# Patient Record
Sex: Male | Born: 1970 | Race: Black or African American | Hispanic: No | Marital: Single | Smoking: Current every day smoker
Health system: Southern US, Community
[De-identification: ages and names within clinical notes are randomized; demographics above are authoritative.]

## PROBLEM LIST (undated history)

## (undated) DIAGNOSIS — F209 Schizophrenia, unspecified: Secondary | ICD-10-CM

---

## 2014-05-22 DIAGNOSIS — F209 Schizophrenia, unspecified: Secondary | ICD-10-CM | POA: Diagnosis not present

## 2014-07-07 DIAGNOSIS — F209 Schizophrenia, unspecified: Secondary | ICD-10-CM | POA: Diagnosis not present

## 2014-09-13 DIAGNOSIS — F209 Schizophrenia, unspecified: Secondary | ICD-10-CM | POA: Diagnosis not present

## 2014-12-05 DIAGNOSIS — F209 Schizophrenia, unspecified: Secondary | ICD-10-CM | POA: Diagnosis not present

## 2015-02-19 DIAGNOSIS — F209 Schizophrenia, unspecified: Secondary | ICD-10-CM | POA: Diagnosis not present

## 2015-06-11 DIAGNOSIS — F209 Schizophrenia, unspecified: Secondary | ICD-10-CM | POA: Diagnosis not present

## 2015-08-23 ENCOUNTER — Emergency Department (HOSPITAL_COMMUNITY)
Admission: EM | Admit: 2015-08-23 | Discharge: 2015-08-23 | Disposition: A | Discharge status: Home / Self Care | Payer: Self-pay | Attending: Emergency Medicine | Admitting: Emergency Medicine

## 2015-08-23 ENCOUNTER — Encounter (HOSPITAL_COMMUNITY): Payer: Self-pay | Admitting: Emergency Medicine

## 2015-08-23 DIAGNOSIS — M25541 Pain in joints of right hand: Secondary | ICD-10-CM

## 2015-08-23 DIAGNOSIS — Z8659 Personal history of other mental and behavioral disorders: Secondary | ICD-10-CM | POA: Insufficient documentation

## 2015-08-23 DIAGNOSIS — M25542 Pain in joints of left hand: Secondary | ICD-10-CM | POA: Insufficient documentation

## 2015-08-23 DIAGNOSIS — M67431 Ganglion, right wrist: Secondary | ICD-10-CM | POA: Insufficient documentation

## 2015-08-23 DIAGNOSIS — F17200 Nicotine dependence, unspecified, uncomplicated: Secondary | ICD-10-CM | POA: Insufficient documentation

## 2015-08-23 HISTORY — DX: Schizophrenia, unspecified: F20.9

## 2015-08-23 MED ORDER — ACETAMINOPHEN 500 MG PO TABS: 500.0000 mg | ORAL_TABLET | Freq: Four times a day (QID) | ORAL | Status: AC | PRN

## 2015-08-23 NOTE — Discharge Instructions (Signed)
Ganglion Cyst A ganglion cyst is a noncancerous, fluid-filled lump that occurs near joints or tendons. The ganglion cyst grows out of a joint or the lining of a tendon. It most often develops in the hand or wrist, but it can also develop in the shoulder, elbow, hip, knee, ankle, or foot. The round or oval ganglion cyst can be the size of a pea or larger than a grape. Increased activity may enlarge the size of the cyst because more fluid starts to build up.  CAUSES It is not known what causes a ganglion cyst to grow. However, it may be related to:  Inflammation or irritation around the joint.  An injury.  Repetitive movements or overuse.  Arthritis. RISK FACTORS Risk factors include:  Being a woman.  Being age 44-50. SIGNS AND SYMPTOMS Symptoms may include:   A lump. This most often appears on the hand or wrist, but it can occur in other areas of the body.  Tingling.  Pain.  Numbness.  Muscle weakness.  Weak grip.  Less movement in a joint. DIAGNOSIS Ganglion cysts are most often diagnosed based on a physical exam. Your health care provider will feel the lump and may shine a light alongside it. If it is a ganglion cyst, a light often shines through it. Your health care provider may order an X-ray, ultrasound, or MRI to rule out other conditions. TREATMENT Ganglion cysts usually go away on their own without treatment. If pain or other symptoms are involved, treatment may be needed. Treatment is also needed if the ganglion cyst limits your movement or if it gets infected. Treatment may include:  Wearing a brace or splint on your wrist or finger.  Taking anti-inflammatory medicine.  Draining fluid from the lump with a needle (aspiration).  Injecting a steroid into the joint.  Surgery to remove the ganglion cyst. HOME CARE INSTRUCTIONS  Do not press on the ganglion cyst, poke it with a needle, or hit it.  Take medicines only as directed by your health care  provider.  Wear your brace or splint as directed by your health care provider.  Watch your ganglion cyst for any changes.  Keep all follow-up visits as directed by your health care provider. This is important. SEEK MEDICAL CARE IF:  Your ganglion cyst becomes larger or more painful.  You have increased redness, red streaks, or swelling.  You have pus coming from the lump.  You have weakness or numbness in the affected area.  You have a fever or chills.   This information is not intended to replace advice given to you by your health care provider. Make sure you discuss any questions you have with your health care provider.   Document Released: 05/02/2000 Document Revised: 05/26/2014 Document Reviewed: 10/18/2013 Elsevier Interactive Patient Education 2016 [employer]  Musculoskeletal Pain Musculoskeletal pain is muscle and boney aches and pains. These pains can occur in any part of the body. Your caregiver may treat you without knowing the cause of the pain. They may treat you if blood or urine tests, X-rays, and other tests were normal.  CAUSES There is often not a definite cause or reason for these pains. These pains may be caused by a type of germ (virus). The discomfort may also come from overuse. Overuse includes working out too hard when your body is not fit. Boney aches also come from weather changes. Bone is sensitive to atmospheric pressure changes. HOME CARE INSTRUCTIONS   Ask when your test results will be  ready. Make sure you get your test results.  Only take over-the-counter or prescription medicines for pain, discomfort, or fever as directed by your caregiver. If you were given medications for your condition, do not drive, operate machinery or power tools, or sign legal documents for 24 hours. Do not drink alcohol. Do not take sleeping pills or other medications that may interfere with treatment.  Continue all activities unless the activities cause more pain. When  the pain lessens, slowly resume normal activities. Gradually increase the intensity and duration of the activities or exercise.  During periods of severe pain, bed rest may be helpful. Lay or sit in any position that is comfortable.  Putting ice on the injured area.  Put ice in a bag.  Place a towel between your skin and the bag.  Leave the ice on for 15 to 20 minutes, 3 to 4 times a day.  Follow up with your caregiver for continued problems and no reason can be found for the pain. If the pain becomes worse or does not go away, it may be necessary to repeat tests or do additional testing. Your caregiver may need to look further for a possible cause. SEEK IMMEDIATE MEDICAL CARE IF:  You have pain that is getting worse and is not relieved by medications.  You develop chest pain that is associated with shortness or breath, sweating, feeling sick to your stomach (nauseous), or throw up (vomit).  Your pain becomes localized to the abdomen.  You develop any new symptoms that seem different or that concern you. MAKE SURE YOU:   Understand these instructions.  Will watch your condition.  Will get help right away if you are not doing well or get worse.   This information is not intended to replace advice given to you by your health care provider. Make sure you discuss any questions you have with your health care provider.   Document Released: 05/05/2005 Document Revised: 07/28/2011 Document Reviewed: 01/07/2013 Elsevier Interactive Patient Education [employer]

## 2015-08-23 NOTE — ED Provider Notes (Signed)
CSN: [id number]     Arrival date & time 08/23/15  1226 History  By signing my name below, I, Travis Anderson, attest that this documentation has been prepared under the direction and in the presence of Fayrene HelperBowie Marshella Tello, PA-C. Electronically Signed: Ronney LionSuzanne Anderson, ED Scribe. 08/23/2015. 2:01 PM.  Chief Complaint  Patient presents with  . Hand Pain   The history is provided by the patient. No language interpreter was used.    HPI Comments: Travis BunnellKwame Anderson is a 45 y.o. male who presents to the Emergency Department complaining of bilateral hand pain that began months ago. He states that for the past several months, he has woken up feeling like his hand is tight. Patient states he does heavy lifting. He denies a history of DM. He states he has been running hot water over his hand with no relief. Patient states he is right-hand-dominant. Patient has NKDA. He denies dropping objects, having focal weakness, hx of carpal tunnel syndrome, or prior hx of DVTs.    Pt notice a nodule to his right wrist for more than a month.  Denies any injury, report occasional sharp pain, not currently.    Past Medical History  Diagnosis Date  . Schizophrenia (HCC)    History reviewed. No pertinent past surgical history. No family history on file. Social History  Substance Use Topics  . Smoking status: Current Every Day Smoker  . Smokeless tobacco: None  . Alcohol Use: No    Review of Systems  Musculoskeletal: Positive for myalgias (bilateral hand pain).      Allergies  Review of patient's allergies indicates no known allergies.  Home Medications   Prior to Admission medications   Not on File   BP 110/61 mmHg  Pulse 85  Temp(Src) 98.4 F (36.9 C) (Oral)  Resp 16  SpO2 97% Physical Exam  Constitutional: He is oriented to person, place, and time. He appears well-developed and well-nourished. No distress.  HENT:  Head: Normocephalic and atraumatic.  Eyes: Conjunctivae and EOM are normal.  Neck: Neck supple. No  tracheal deviation present.  Cardiovascular: Normal rate.   Pulmonary/Chest: Effort normal. No respiratory distress.  Musculoskeletal: Normal range of motion.  Right hand: A mobile nodule noted to the dorsum of the hand, proximal to right wrist. Non-tender, no signs of infection.  Bilateral hands: Grip strength normal. Normal ROM. Brisk cap refill. Radial pulses normal. Skin is normal.  Neurological: He is alert and oriented to person, place, and time.  Skin: Skin is warm and dry.  Psychiatric: He has a normal mood and affect. His behavior is normal.  Nursing note and vitals reviewed.   ED Course  Procedures (including critical care time)  DIAGNOSTIC STUDIES: Oxygen Saturation is 97% on RA, normal by my interpretation.    COORDINATION OF CARE: 2:01 PM - Suspect early arthritis, in addition to ganglion cyst on right hand. Discussed treatment plan with pt at bedside which includes Tylenol as needed for pain. Recommended exercises. Pt verbalized understanding and agreed to plan.    MDM   Final diagnoses:  Arthralgia of hands, bilateral  Ganglion cyst of wrist, right   BP 110/61 mmHg  Pulse 85  Temp(Src) 98.4 F (36.9 C) (Oral)  Resp 16  SpO2 97%   I personally performed the services described in this documentation, which was scribed in my presence. The recorded information has been reviewed and is accurate.       Fayrene HelperBowie Reatha Sur, PA-C 08/23/15 1416  Benjiman CoreNathan Pickering, MD 08/24/15 [phone]

## 2015-08-23 NOTE — ED Notes (Signed)
C/o bilateral hand pain when he wakes up in the am. Onset "months ago". Also has nodule on right wrist.

## 2015-09-12 DIAGNOSIS — F209 Schizophrenia, unspecified: Secondary | ICD-10-CM | POA: Diagnosis not present

## 2015-12-05 DIAGNOSIS — F209 Schizophrenia, unspecified: Secondary | ICD-10-CM | POA: Diagnosis not present

## 2016-02-19 DIAGNOSIS — F209 Schizophrenia, unspecified: Secondary | ICD-10-CM | POA: Diagnosis not present

## 2016-05-07 DIAGNOSIS — F209 Schizophrenia, unspecified: Secondary | ICD-10-CM | POA: Diagnosis not present

## 2017-07-17 DIAGNOSIS — R1110 Vomiting, unspecified: Secondary | ICD-10-CM | POA: Diagnosis not present

## 2017-07-17 DIAGNOSIS — Z5321 Procedure and treatment not carried out due to patient leaving prior to being seen by health care provider: Secondary | ICD-10-CM | POA: Diagnosis not present

## 2017-08-06 ENCOUNTER — Emergency Department (HOSPITAL_COMMUNITY): Payer: Medicare Other

## 2017-08-06 ENCOUNTER — Emergency Department (HOSPITAL_COMMUNITY)
Admission: EM | Admit: 2017-08-06 | Discharge: 2017-08-06 | Disposition: A | Discharge status: Home / Self Care | Payer: Medicare Other | Attending: Emergency Medicine | Admitting: Emergency Medicine

## 2017-08-06 ENCOUNTER — Encounter (HOSPITAL_COMMUNITY): Payer: Self-pay

## 2017-08-06 ENCOUNTER — Other Ambulatory Visit: Payer: Self-pay

## 2017-08-06 DIAGNOSIS — F17200 Nicotine dependence, unspecified, uncomplicated: Secondary | ICD-10-CM | POA: Diagnosis not present

## 2017-08-06 DIAGNOSIS — S8992XA Unspecified injury of left lower leg, initial encounter: Secondary | ICD-10-CM | POA: Diagnosis not present

## 2017-08-06 DIAGNOSIS — M545 Low back pain: Secondary | ICD-10-CM | POA: Diagnosis not present

## 2017-08-06 DIAGNOSIS — M25562 Pain in left knee: Secondary | ICD-10-CM | POA: Diagnosis not present

## 2017-08-06 DIAGNOSIS — Y9389 Activity, other specified: Secondary | ICD-10-CM | POA: Diagnosis not present

## 2017-08-06 DIAGNOSIS — S8991XA Unspecified injury of right lower leg, initial encounter: Secondary | ICD-10-CM | POA: Diagnosis not present

## 2017-08-06 DIAGNOSIS — M542 Cervicalgia: Secondary | ICD-10-CM | POA: Diagnosis not present

## 2017-08-06 DIAGNOSIS — S161XXA Strain of muscle, fascia and tendon at neck level, initial encounter: Secondary | ICD-10-CM | POA: Diagnosis not present

## 2017-08-06 DIAGNOSIS — Y92410 Unspecified street and highway as the place of occurrence of the external cause: Secondary | ICD-10-CM | POA: Insufficient documentation

## 2017-08-06 DIAGNOSIS — S86911A Strain of unspecified muscle(s) and tendon(s) at lower leg level, right leg, initial encounter: Secondary | ICD-10-CM | POA: Diagnosis not present

## 2017-08-06 DIAGNOSIS — S86912A Strain of unspecified muscle(s) and tendon(s) at lower leg level, left leg, initial encounter: Secondary | ICD-10-CM | POA: Diagnosis not present

## 2017-08-06 DIAGNOSIS — M546 Pain in thoracic spine: Secondary | ICD-10-CM | POA: Diagnosis not present

## 2017-08-06 DIAGNOSIS — T148XXA Other injury of unspecified body region, initial encounter: Secondary | ICD-10-CM

## 2017-08-06 DIAGNOSIS — Y999 Unspecified external cause status: Secondary | ICD-10-CM | POA: Insufficient documentation

## 2017-08-06 DIAGNOSIS — M25561 Pain in right knee: Secondary | ICD-10-CM | POA: Diagnosis not present

## 2017-08-06 DIAGNOSIS — S39012A Strain of muscle, fascia and tendon of lower back, initial encounter: Secondary | ICD-10-CM | POA: Diagnosis not present

## 2017-08-06 DIAGNOSIS — S199XXA Unspecified injury of neck, initial encounter: Secondary | ICD-10-CM | POA: Diagnosis not present

## 2017-08-06 MED ORDER — METHOCARBAMOL 500 MG PO TABS: 500.0000 mg | ORAL_TABLET | Freq: Two times a day (BID) | ORAL | 0 refills | Status: AC

## 2017-08-06 MED ORDER — METHOCARBAMOL 500 MG PO TABS
500.0000 mg | ORAL_TABLET | Freq: Once | ORAL | Status: AC
  Administered 2017-08-06: 500 mg via ORAL
  Filled 2017-08-06: qty 1

## 2017-08-06 NOTE — ED Notes (Signed)
See PA note for assessment  

## 2017-08-06 NOTE — ED Notes (Signed)
Pt departed in NAD, refused use of wheelchair.  

## 2017-08-06 NOTE — Discharge Instructions (Signed)
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
## 2018-04-09 ENCOUNTER — Encounter (HOSPITAL_COMMUNITY): Payer: Self-pay | Admitting: Emergency Medicine

## 2018-04-09 ENCOUNTER — Other Ambulatory Visit: Payer: Self-pay

## 2018-04-09 ENCOUNTER — Emergency Department (HOSPITAL_COMMUNITY)
Admission: EM | Admit: 2018-04-09 | Discharge: 2018-04-09 | Disposition: A | Discharge status: Home / Self Care | Payer: Medicare Other | Attending: Emergency Medicine | Admitting: Emergency Medicine

## 2018-04-09 DIAGNOSIS — F17200 Nicotine dependence, unspecified, uncomplicated: Secondary | ICD-10-CM | POA: Diagnosis not present

## 2018-04-09 DIAGNOSIS — R112 Nausea with vomiting, unspecified: Secondary | ICD-10-CM | POA: Diagnosis not present

## 2018-04-09 LAB — CBC
HCT: 39.8 % (ref 39.0–52.0)
HEMOGLOBIN: 13.5 g/dL (ref 13.0–17.0)
MCH: 27.7 pg (ref 26.0–34.0)
MCHC: 33.9 g/dL (ref 30.0–36.0)
MCV: 81.6 fL (ref 80.0–100.0)
Platelets: 231 10*3/uL (ref 150–400)
RBC: 4.88 MIL/uL (ref 4.22–5.81)
RDW: 12.9 % (ref 11.5–15.5)
WBC: 4.1 10*3/uL (ref 4.0–10.5)
nRBC: 0 % (ref 0.0–0.2)

## 2018-04-09 LAB — COMPREHENSIVE METABOLIC PANEL
ALBUMIN: 3.9 g/dL (ref 3.5–5.0)
ALT: 16 U/L (ref 0–44)
AST: 21 U/L (ref 15–41)
Alkaline Phosphatase: 43 U/L (ref 38–126)
Anion gap: 6 (ref 5–15)
BUN: 19 mg/dL (ref 6–20)
CHLORIDE: 109 mmol/L (ref 98–111)
CO2: 24 mmol/L (ref 22–32)
CREATININE: 1.23 mg/dL (ref 0.61–1.24)
Calcium: 8.7 mg/dL — ABNORMAL LOW (ref 8.9–10.3)
GFR calc Af Amer: >60 mL/min (ref >60)
GLUCOSE: 101 mg/dL — AB (ref 70–99)
POTASSIUM: 3.9 mmol/L (ref 3.5–5.1)
Sodium: 139 mmol/L (ref 135–145)
Total Bilirubin: 0.9 mg/dL (ref 0.3–1.2)
Total Protein: 6.9 g/dL (ref 6.5–8.1)

## 2018-04-09 LAB — LIPASE, BLOOD: LIPASE: 43 U/L (ref 11–51)

## 2018-04-09 MED ORDER — LACTATED RINGERS IV BOLUS
1000.0000 mL | Freq: Once | INTRAVENOUS | Status: AC
  Administered 2018-04-09: 1000 mL via INTRAVENOUS

## 2018-04-09 MED ORDER — ONDANSETRON HCL 4 MG/2ML IJ SOLN
4.0000 mg | Freq: Once | INTRAMUSCULAR | Status: AC
  Administered 2018-04-09: 4 mg via INTRAVENOUS
  Filled 2018-04-09: qty 2

## 2018-04-09 MED ORDER — ONDANSETRON 4 MG PO TBDP: 4.0000 mg | ORAL_TABLET | Freq: Three times a day (TID) | ORAL | 0 refills | Status: AC | PRN

## 2018-04-09 NOTE — ED Provider Notes (Signed)
[facility]   CSN: [id number] Arrival date & time: 04/09/18  2011     History   Chief Complaint Chief Complaint  Patient presents with  . Emesis    HPI Maryfrances BunnellKwame Coverdale is a 47 y.o. male.  The history is provided by the patient. No language interpreter was used.  Emesis   This is a new problem. The current episode started more than 2 days ago. The problem occurs 2 to 4 times per day. The problem has not changed since onset.The emesis has an appearance of stomach contents. There has been no fever. Associated symptoms include diarrhea. Pertinent negatives include no abdominal pain, no arthralgias, no chills, no cough, no fever, no headaches, no myalgias and no sweats.    Past Medical History:  Diagnosis Date  . Schizophrenia (HCC)     There are no active problems to display for this patient.   History reviewed. No pertinent surgical history.      Home Medications    Prior to Admission medications   Medication Sig Start Date End Date Taking? Authorizing Provider  acetaminophen (TYLENOL) 500 MG tablet Take 1 tablet (500 mg total) by mouth every 6 (six) hours as needed. Patient not taking: Reported on 04/09/2018 08/23/15   Fayrene Helperran, Bowie, PA-C  methocarbamol (ROBAXIN) 500 MG tablet Take 1 tablet (500 mg total) by mouth 2 (two) times daily. Patient not taking: Reported on 04/09/2018 08/06/17   Graciella FreerLayden, Lindsey A, PA-C  ondansetron (ZOFRAN ODT) 4 MG disintegrating tablet Take 1 tablet (4 mg total) by mouth every 8 (eight) hours as needed for nausea or vomiting. 04/09/18   Melene PlanFloyd, Dan, DO    Family History No family history on file.  Social History Social History   Tobacco Use  . Smoking status: Current Every Day Smoker  . Smokeless tobacco: Never Used  Substance Use Topics  . Alcohol use: Yes  . Drug use: Yes    Types: Marijuana     Allergies   Patient has no known allergies.   Review of Systems Review of Systems   Constitutional: Positive for activity change. Negative for appetite change, chills and fever.  HENT: Negative for ear pain and sore throat.   Eyes: Negative for pain and visual disturbance.  Respiratory: Negative for cough and shortness of breath.   Cardiovascular: Negative for chest pain and palpitations.  Gastrointestinal: Positive for diarrhea and vomiting. Negative for abdominal pain.  Genitourinary: Negative for dysuria and hematuria.  Musculoskeletal: Negative for arthralgias, back pain and myalgias.  Skin: Negative for color change and rash.  Neurological: Negative for seizures, syncope and headaches.  All other systems reviewed and are negative.    Physical Exam Updated Vital Signs BP (!) 148/86 (BP Location: Right Arm)   Pulse 68   Temp 98.4 F (36.9 C) (Oral)   Resp 16   SpO2 100%   Physical Exam  Constitutional: He appears well-developed and well-nourished.  HENT:  Head: Normocephalic and atraumatic.  Eyes: Conjunctivae are normal.  Neck: Neck supple.  Cardiovascular: Normal rate and regular rhythm.  No murmur heard. Pulmonary/Chest: Effort normal and breath sounds normal. No respiratory distress.  Abdominal: Soft. He exhibits no distension. There is no tenderness. There is no rebound and no guarding.  Musculoskeletal: He exhibits no edema.  Neurological: He is alert.  Skin: Skin is warm and dry.  Psychiatric: He has a normal mood and affect.  Nursing note and vitals reviewed.    ED Treatments /  Results  Labs (all labs ordered are listed, but only abnormal results are displayed) Labs Reviewed  COMPREHENSIVE METABOLIC PANEL - Abnormal; Notable for the following components:      Result Value   Glucose, Bld 101 (*)    Calcium 8.7 (*)    All other components within normal limits  LIPASE, BLOOD  CBC    EKG None  Radiology No results found.  Procedures Procedures (including critical care time)  Medications Ordered in ED Medications  lactated  ringers bolus 1,000 mL (0 mLs Intravenous Stopped 04/09/18 2233)  ondansetron (ZOFRAN) injection 4 mg (4 mg Intravenous Given 04/09/18 2143)  lactated ringers bolus 1,000 mL (0 mLs Intravenous Stopped 04/09/18 2233)     Initial Impression / Assessment and Plan / ED Course  I have reviewed the triage vital signs and the nursing notes.  Pertinent labs & imaging results that were available during my care of the patient were reviewed by me and considered in my medical decision making (see chart for details).     Patient is a 46 y.o. male with PMHx of schizophrenia presenting for 3 days of nausea, vomiting and diarrhea. Last episode in the waiting room.  Abd exam is benign, no tenderness. No indication for CT A/P at this time.  1L LR given, 4mg  IV zofran given for symptomatic relief.  Basic labs were unremarkable. Patient will be discharge. Strict return precautions were discussed.   Final Clinical Impressions(s) / ED Diagnoses   Final diagnoses:  Nausea and vomiting in adult    ED Discharge Orders         Ordered    ondansetron (ZOFRAN ODT) 4 MG disintegrating tablet  Every 8 hours PRN     04/09/18 2325           Joaquin Courts, MD 04/10/18 1214    Melene Plan, DO 04/10/18 1620

## 2018-04-09 NOTE — ED Triage Notes (Signed)
Co nausea, vomiting, and diarrhea x 3 days.  Denies abd pain.

## 2018-04-09 NOTE — ED Notes (Signed)
Patient up for discharge. When returning to room with paperwork, pt had removed IV and left before instructions could be reviewed. Discharge paperwork was left. Discharge vitals were obtained.

## 2018-04-09 NOTE — Discharge Instructions (Signed)
Try zantac or pepcid twice a day.  Try to avoid things that may make this worse, most commonly these are spicy foods tomato based products fatty foods chocolate and peppermint.  Alcohol and tobacco can also make this worse.  Return to the emergency department for sudden worsening pain fever or inability to eat or drink. ° °
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
